# Patient Record
Sex: Female | Born: 1970 | Race: Black or African American | Hispanic: No | Marital: Single
Health system: Southern US, Community
[De-identification: ages and names within clinical notes are randomized; demographics above are authoritative.]

## PROBLEM LIST (undated history)

## (undated) DIAGNOSIS — F3189 Other bipolar disorder: Secondary | ICD-10-CM

## (undated) HISTORY — DX: Other bipolar disorder: F31.89

---

## 2007-09-27 ENCOUNTER — Emergency Department (HOSPITAL_COMMUNITY): Admission: EM | Admit: 2007-09-27 | Discharge: 2007-09-27 | Payer: Self-pay | Admitting: Emergency Medicine

## 2008-03-21 ENCOUNTER — Emergency Department (HOSPITAL_COMMUNITY): Admission: EM | Admit: 2008-03-21 | Discharge: 2008-03-21 | Payer: Self-pay | Admitting: Emergency Medicine

## 2008-03-24 ENCOUNTER — Emergency Department (HOSPITAL_COMMUNITY): Admission: EM | Admit: 2008-03-24 | Discharge: 2008-03-24 | Payer: Self-pay | Admitting: Emergency Medicine

## 2011-04-24 ENCOUNTER — Ambulatory Visit (HOSPITAL_COMMUNITY): Payer: Self-pay | Admitting: Physician Assistant

## 2011-05-23 ENCOUNTER — Ambulatory Visit (HOSPITAL_COMMUNITY): Payer: Managed Care, Other (non HMO) | Admitting: Physician Assistant

## 2011-05-23 DIAGNOSIS — F319 Bipolar disorder, unspecified: Secondary | ICD-10-CM

## 2011-06-20 ENCOUNTER — Encounter (HOSPITAL_COMMUNITY): Payer: Managed Care, Other (non HMO) | Admitting: Physician Assistant

## 2011-06-20 DIAGNOSIS — F3189 Other bipolar disorder: Secondary | ICD-10-CM

## 2011-07-31 LAB — URINALYSIS, ROUTINE W REFLEX MICROSCOPIC
Protein, ur: NEGATIVE
pH: 5

## 2011-08-06 ENCOUNTER — Encounter (HOSPITAL_COMMUNITY): Payer: Managed Care, Other (non HMO) | Admitting: Physician Assistant

## 2011-09-23 ENCOUNTER — Other Ambulatory Visit (HOSPITAL_COMMUNITY): Payer: Self-pay

## 2011-09-24 ENCOUNTER — Ambulatory Visit (HOSPITAL_COMMUNITY): Payer: Managed Care, Other (non HMO) | Admitting: Physician Assistant

## 2011-09-24 ENCOUNTER — Encounter (HOSPITAL_COMMUNITY): Payer: Self-pay | Admitting: Physician Assistant

## 2011-09-24 DIAGNOSIS — F988 Other specified behavioral and emotional disorders with onset usually occurring in childhood and adolescence: Secondary | ICD-10-CM

## 2011-09-24 MED ORDER — LISDEXAMFETAMINE DIMESYLATE 40 MG PO CAPS: 40.0000 mg | ORAL_CAPSULE | ORAL | Status: DC

## 2011-09-24 NOTE — Progress Notes (Signed)
   [facility] Behavioral Health Follow-up Outpatient Visit  Lea Baine 02/10/1971  Date: [id number]   Subjective: Has been off meds except Ambien.  C/O depression.  Felt Wellbutrin increased irritability.  Felt Topamax was causing increased daytime somnolence.  Continues to have symptoms of ADHD - irritability, easily distracted, disorganization, difficulty in completing projects...  Sleep is good.  Appetite: eating probably too much.  There were no vitals filed for this visit.  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  Call if necessary  Cashton Hosley, PA

## 2011-10-17 ENCOUNTER — Other Ambulatory Visit (HOSPITAL_COMMUNITY): Payer: Self-pay | Admitting: Psychology

## 2011-10-17 DIAGNOSIS — F3189 Other bipolar disorder: Secondary | ICD-10-CM

## 2011-10-17 DIAGNOSIS — G4700 Insomnia, unspecified: Secondary | ICD-10-CM

## 2011-10-17 MED ORDER — ZOLPIDEM TARTRATE 10 MG PO TABS: ORAL_TABLET | ORAL | Status: DC

## 2011-10-22 ENCOUNTER — Other Ambulatory Visit (HOSPITAL_COMMUNITY): Payer: Self-pay | Admitting: Physician Assistant

## 2011-10-22 DIAGNOSIS — F988 Other specified behavioral and emotional disorders with onset usually occurring in childhood and adolescence: Secondary | ICD-10-CM

## 2011-10-22 MED ORDER — LISDEXAMFETAMINE DIMESYLATE 40 MG PO CAPS: 40.0000 mg | ORAL_CAPSULE | ORAL | Status: DC

## 2011-10-24 ENCOUNTER — Ambulatory Visit (HOSPITAL_COMMUNITY): Payer: Managed Care, Other (non HMO) | Admitting: Physician Assistant

## 2011-12-02 ENCOUNTER — Ambulatory Visit (INDEPENDENT_AMBULATORY_CARE_PROVIDER_SITE_OTHER): Payer: Managed Care, Other (non HMO) | Admitting: Physician Assistant

## 2011-12-02 DIAGNOSIS — F988 Other specified behavioral and emotional disorders with onset usually occurring in childhood and adolescence: Secondary | ICD-10-CM

## 2011-12-02 DIAGNOSIS — F331 Major depressive disorder, recurrent, moderate: Secondary | ICD-10-CM

## 2011-12-02 MED ORDER — LISDEXAMFETAMINE DIMESYLATE 40 MG PO CAPS: 40.0000 mg | ORAL_CAPSULE | ORAL | Status: DC

## 2011-12-02 MED ORDER — ESCITALOPRAM OXALATE 10 MG PO TABS: 10.0000 mg | ORAL_TABLET | Freq: Every day | ORAL | Status: DC

## 2011-12-03 NOTE — Progress Notes (Signed)
   [facility] Behavioral Health Follow-up Outpatient Visit  Christina Carpenter [birth year]  Date: 12/02/11  Subjective:  Christina Carpenter presents today to followup on her medication for ADHD. She likes the Vyvanse and feels that it is working well. She also reports that he she is sleeping better with the Ambien. She reports that she has been depressed and wonders what she can take for that. She has been very tearful on a daily basis recently. She did not do well on Wellbutrin previously , complaining that it caused her to feel more irritable. She denies any current suicidal or homicidal ideation. She denies any auditory or visual hallucinations.   There were no vitals filed for this visit.  Mental Status Examination  Appearance: Well groomed and casually dressed Alert: Yes Attention: good  Cooperative: Yes Eye Contact: Good Speech: Clear and even Psychomotor Activity: Normal Memory/Concentration: Intact Oriented: person, place, time/date and situation Mood: Depressed Affect: Congruent and Tearful Thought Processes and Associations: Coherent, Goal Directed and Linear Fund of Knowledge: Good Thought Content:  Insight: Good Judgement: Good  Diagnosis: ADHD, inattentive type; major depressive disorder, recurrent, moderate  Treatment Plan: We will continue the Vyvanse and a Ambien as currently prescribed and try her on a low-dose (10 mg) of Lexapro. I discussed with her the action of SSRIs, including the possibility of experiencing a manic episode. She agrees to be watchful for that if I prescribe an SSRI. Christina Carpenter will return for followup in one month.   Mayo Faulk, PA

## 2011-12-30 ENCOUNTER — Ambulatory Visit (HOSPITAL_COMMUNITY): Payer: Managed Care, Other (non HMO) | Admitting: Physician Assistant

## 2012-01-06 ENCOUNTER — Other Ambulatory Visit (HOSPITAL_COMMUNITY): Payer: Self-pay | Admitting: Physician Assistant

## 2012-01-06 DIAGNOSIS — F331 Major depressive disorder, recurrent, moderate: Secondary | ICD-10-CM

## 2012-01-06 MED ORDER — ESCITALOPRAM OXALATE 10 MG PO TABS: 10.0000 mg | ORAL_TABLET | Freq: Every day | ORAL | Status: DC

## 2012-01-27 ENCOUNTER — Ambulatory Visit (INDEPENDENT_AMBULATORY_CARE_PROVIDER_SITE_OTHER): Payer: Managed Care, Other (non HMO) | Admitting: Physician Assistant

## 2012-01-27 DIAGNOSIS — F988 Other specified behavioral and emotional disorders with onset usually occurring in childhood and adolescence: Secondary | ICD-10-CM

## 2012-01-27 DIAGNOSIS — F331 Major depressive disorder, recurrent, moderate: Secondary | ICD-10-CM

## 2012-01-27 MED ORDER — ESCITALOPRAM OXALATE 10 MG PO TABS: 10.0000 mg | ORAL_TABLET | Freq: Every day | ORAL | Status: AC

## 2012-01-27 MED ORDER — LISDEXAMFETAMINE DIMESYLATE 40 MG PO CAPS: 40.0000 mg | ORAL_CAPSULE | ORAL | Status: DC

## 2012-01-27 NOTE — Progress Notes (Signed)
   [facility] Behavioral Health Follow-up Outpatient Visit  Miyoko Hashimi [birth year]  Date: 01/27/12   Subjective: Christina Carpenter presents today to followup on her medications prescribed for ADHD, depression, and insomnia. She reports that she has been doing extremely well. She has been taking the Lexapro 10 mg daily, and feels that that helps her to sleep better. She also does reports that she only takes her Ambien approximately once every 2 weeks. She feels that the dose of Vyvanse is appropriate, and denies any side effects. She denies any suicidal or homicidal ideation. She denies any auditory or visual hallucinations.  There were no vitals filed for this visit.  Mental Status Examination  Appearance: Well groomed and casually dressed Alert: Yes Attention: good  Cooperative: Yes Eye Contact: Good Speech: Clear and even Psychomotor Activity: Normal Memory/Concentration: Intact Oriented: person, place, time/date and situation Mood: Euthymic Affect: Appropriate Thought Processes and Associations: Linear Fund of Knowledge: Good Thought Content: Normal Insight: Good Judgement: Good  Diagnosis: ADHD, inattentive type; major depressive disorder recurrent moderate  Treatment Plan: We will continue her Vyvanse at 40 mg daily, Lexapro 10 mg daily, and Ambien 10 mg daily as needed.  Christina Petrucci, PA-C

## 2012-04-27 ENCOUNTER — Other Ambulatory Visit (HOSPITAL_COMMUNITY): Payer: Self-pay | Admitting: *Deleted

## 2012-04-27 DIAGNOSIS — F988 Other specified behavioral and emotional disorders with onset usually occurring in childhood and adolescence: Secondary | ICD-10-CM

## 2012-04-27 MED ORDER — LISDEXAMFETAMINE DIMESYLATE 40 MG PO CAPS: 40.0000 mg | ORAL_CAPSULE | ORAL | Status: DC

## 2012-04-28 ENCOUNTER — Ambulatory Visit (HOSPITAL_COMMUNITY): Payer: Self-pay | Admitting: Physician Assistant

## 2012-06-10 ENCOUNTER — Ambulatory Visit (INDEPENDENT_AMBULATORY_CARE_PROVIDER_SITE_OTHER): Payer: Managed Care, Other (non HMO) | Admitting: Physician Assistant

## 2012-06-10 DIAGNOSIS — F988 Other specified behavioral and emotional disorders with onset usually occurring in childhood and adolescence: Secondary | ICD-10-CM

## 2012-06-10 DIAGNOSIS — F331 Major depressive disorder, recurrent, moderate: Secondary | ICD-10-CM

## 2012-06-10 MED ORDER — LISDEXAMFETAMINE DIMESYLATE 60 MG PO CAPS: 60.0000 mg | ORAL_CAPSULE | ORAL | Status: DC

## 2012-06-10 MED ORDER — SERTRALINE HCL 50 MG PO TABS: 50.0000 mg | ORAL_TABLET | Freq: Every day | ORAL | Status: DC

## 2012-06-10 NOTE — Progress Notes (Signed)
   [facility] Behavioral Health Follow-up Outpatient Visit  Christina Carpenter May 08, 1971  Date: 06/10/2012   Subjective: [name] presents today to followup on her treatment for ADHD and depression. She feels that her Vyvanse dose may be a slight increase. She also asked if the Lexapro can cause her to have down days. She states that she has these days of sadness where she is very sensitive and cries easily last about one week. She admits that she stopped taking her Lexapro at least one week ago. She reports that her energy is fair. She endorses that she sleeps well, and only has to use the Ambien as needed once every 2-3 weeks. She denies any suicidal or homicidal ideation. She denies any auditory or visual hallucinations.  There were no vitals filed for this visit.  Mental Status Examination  Appearance: Well groomed and casually dressed in her work uniform Alert: Yes Attention: good  Cooperative: Yes Eye Contact: Good Speech: Clear and coherent Psychomotor Activity: Normal Memory/Concentration: Intact Oriented: person, place, time/date and situation Mood: Euthymic Affect: Appropriate Thought Processes and Associations: Linear Fund of Knowledge: Good Thought Content:  Insight: Good Judgement: Good  Diagnosis: ADHD, inattentive type; major depressive disorder, recurrent, moderate  Treatment Plan: We will start her on Zoloft 50 mg daily, increase her Vyvanse to 60 mg daily, and continue her Ambien to be used as needed. She will return in 3 months for followup.  Katie Faraone, PA-C

## 2012-08-31 ENCOUNTER — Other Ambulatory Visit (HOSPITAL_COMMUNITY): Payer: Self-pay | Admitting: *Deleted

## 2012-08-31 DIAGNOSIS — F331 Major depressive disorder, recurrent, moderate: Secondary | ICD-10-CM

## 2012-08-31 MED ORDER — SERTRALINE HCL 50 MG PO TABS: 50.0000 mg | ORAL_TABLET | Freq: Every day | ORAL | Status: DC

## 2012-09-08 ENCOUNTER — Ambulatory Visit (INDEPENDENT_AMBULATORY_CARE_PROVIDER_SITE_OTHER): Payer: Managed Care, Other (non HMO) | Admitting: Physician Assistant

## 2012-09-08 DIAGNOSIS — F330 Major depressive disorder, recurrent, mild: Secondary | ICD-10-CM

## 2012-09-08 DIAGNOSIS — F331 Major depressive disorder, recurrent, moderate: Secondary | ICD-10-CM

## 2012-09-08 DIAGNOSIS — F988 Other specified behavioral and emotional disorders with onset usually occurring in childhood and adolescence: Secondary | ICD-10-CM

## 2012-09-08 MED ORDER — LISDEXAMFETAMINE DIMESYLATE 60 MG PO CAPS: 60.0000 mg | ORAL_CAPSULE | ORAL | Status: DC

## 2012-09-08 MED ORDER — ZOLPIDEM TARTRATE 10 MG PO TABS: 10.0000 mg | ORAL_TABLET | Freq: Every evening | ORAL | Status: AC | PRN

## 2012-09-08 MED ORDER — SERTRALINE HCL 100 MG PO TABS: 100.0000 mg | ORAL_TABLET | Freq: Every day | ORAL | Status: DC

## 2012-09-08 NOTE — Progress Notes (Signed)
   [facility] Behavioral Health Follow-up Outpatient Visit  Marae Cottrell 1971-01-27  Date: 09/08/2012   Subjective: Kileen presents today to follow up on her treatment for ADHD and depression. She reports that the change from Lexapro to Zoloft went well, and she has not had as many down days. She does feel that the dose could be increased. She went about one month without having to take any Ambien, but recently restarted taking that. She feels that the Vyvanse dose is appropriate and feels no change for that. She endorses a stable mood. She denies any suicidal or homicidal ideation. She denies any auditory or visual hallucinations.  There were no vitals filed for this visit.  Mental Status Examination  Appearance: Well groomed and dressed in her work clothes Alert: Yes Attention: good  Cooperative: Yes Eye Contact: Good Speech: Clear and coherent Psychomotor Activity: Normal Memory/Concentration: Intact Oriented: person, place, time/date and situation Mood: Euthymic Affect: Appropriate Thought Processes and Associations: Linear Fund of Knowledge: Good Thought Content: Normal Insight: Good Judgement: Good  Diagnosis: ADHD, inattentive type; major depressive disorder, recurrent, mild  Treatment Plan: We will increase her Zoloft to 100 mg daily, continue her Vyvanse 60 mg daily, and continue Ambien 10 mg at bedtime as needed. She will return for followup in 3 months.  Braxson Hollingsworth, PA-C

## 2012-12-09 ENCOUNTER — Ambulatory Visit (INDEPENDENT_AMBULATORY_CARE_PROVIDER_SITE_OTHER): Payer: Managed Care, Other (non HMO) | Admitting: Physician Assistant

## 2012-12-09 DIAGNOSIS — F331 Major depressive disorder, recurrent, moderate: Secondary | ICD-10-CM

## 2012-12-09 DIAGNOSIS — F988 Other specified behavioral and emotional disorders with onset usually occurring in childhood and adolescence: Secondary | ICD-10-CM

## 2012-12-09 MED ORDER — ZOLPIDEM TARTRATE 10 MG PO TABS: 10.0000 mg | ORAL_TABLET | Freq: Every evening | ORAL | Status: AC | PRN

## 2012-12-09 MED ORDER — LISDEXAMFETAMINE DIMESYLATE 60 MG PO CAPS: 60.0000 mg | ORAL_CAPSULE | ORAL | Status: DC

## 2012-12-09 MED ORDER — SERTRALINE HCL 100 MG PO TABS: 150.0000 mg | ORAL_TABLET | Freq: Every day | ORAL | Status: DC

## 2013-01-01 NOTE — Progress Notes (Signed)
   [facility] Behavioral Health Follow-up Outpatient Visit  Christina Carpenter 09-Jul-1971  Date:  12/09/2012   Subjective: Christina Carpenter presents today to followup on her treatment for ADHD and depression. She reports that she has been having approximately one day a month where she is crying. She feels that her ADHD is well managed. Her sleep and appetite are good.  She denies any suicidal or homicidal ideation. She denies any auditory or visual hallucinations.  There were no vitals filed for this visit.  Mental Status Examination  Appearance: Casual Alert: Yes Attention: good  Cooperative: Yes Eye Contact: Good Speech: Clear and coherent Psychomotor Activity: Normal Memory/Concentration: Intact Oriented: person, place, time/date and situation Mood: Dysphoric Affect: Congruent Thought Processes and Associations: Linear Fund of Knowledge: Good Thought Content: Normal Insight: Good Judgement: Good  Diagnosis: ADHD, inattentive type; major depressive disorder, recurrent, moderate  Treatment Plan: We will increase her Zoloft from 100 mg to 150 mg daily for one month. If she has not experienced significant improvement in her depression, she is instructed to increase it to 200 mg daily. We will continue her Vyvanse 60 mg daily, and Ambien 10 mg at bedtime as needed. She will return for followup in 3 months.  Harish Bram, PA-C

## 2013-02-18 ENCOUNTER — Other Ambulatory Visit: Payer: Self-pay | Admitting: Family Medicine

## 2013-02-18 DIAGNOSIS — Z1231 Encounter for screening mammogram for malignant neoplasm of breast: Secondary | ICD-10-CM

## 2013-03-08 ENCOUNTER — Ambulatory Visit (HOSPITAL_COMMUNITY): Payer: Self-pay | Admitting: Physician Assistant

## 2013-03-15 ENCOUNTER — Ambulatory Visit
Admission: RE | Admit: 2013-03-15 | Discharge: 2013-03-15 | Disposition: A | Discharge status: Home / Self Care | Payer: Managed Care, Other (non HMO) | Source: Ambulatory Visit | Attending: Family Medicine | Admitting: Family Medicine

## 2013-03-15 DIAGNOSIS — Z1231 Encounter for screening mammogram for malignant neoplasm of breast: Secondary | ICD-10-CM

## 2013-03-25 ENCOUNTER — Other Ambulatory Visit (HOSPITAL_COMMUNITY): Payer: Self-pay | Admitting: *Deleted

## 2013-03-25 DIAGNOSIS — F331 Major depressive disorder, recurrent, moderate: Secondary | ICD-10-CM

## 2013-03-29 MED ORDER — SERTRALINE HCL 100 MG PO TABS: 150.0000 mg | ORAL_TABLET | Freq: Every day | ORAL | Status: DC

## 2013-05-04 ENCOUNTER — Ambulatory Visit (INDEPENDENT_AMBULATORY_CARE_PROVIDER_SITE_OTHER): Payer: Managed Care, Other (non HMO) | Admitting: Physician Assistant

## 2013-05-04 VITALS — BP 138/88 | HR 73 | Ht 63.0 in | Wt 185.0 lb

## 2013-05-04 DIAGNOSIS — F988 Other specified behavioral and emotional disorders with onset usually occurring in childhood and adolescence: Secondary | ICD-10-CM

## 2013-05-04 DIAGNOSIS — F331 Major depressive disorder, recurrent, moderate: Secondary | ICD-10-CM

## 2013-05-04 MED ORDER — LISDEXAMFETAMINE DIMESYLATE 60 MG PO CAPS: 60.0000 mg | ORAL_CAPSULE | ORAL | Status: AC

## 2013-05-04 MED ORDER — ARIPIPRAZOLE 5 MG PO TABS: 5.0000 mg | ORAL_TABLET | Freq: Every day | ORAL | Status: AC

## 2013-05-05 NOTE — Progress Notes (Signed)
[facility] Behavioral [facility] [id number] Progress Note  Arshi Duarte [id number] 42 y.o.  05/04/2013 1:15 PM  Chief Complaint: Followup visit for depression and ADHD  History of Present Illness: Christina Carpenter presents today to followup on her treatment for depression and ADHD. At her last visit we increased her Zoloft, and she says that she does not see much change since that increase. She reports that she is no longer crying. She states that she is "down a lot," and that her periods of depression last one to 2 days. She reports that she is tired all the time, although she is sleeping well with Ambien. She states that she goes to bed typically around 12 midnight or 1 AM, and then must be up at 6 in order to go to work. She is working 2 jobs, and does not get off of work until 9:30 PM. She endorses that she has a significant amount of irritability. She denies any suicidal or homicidal ideation. She does not have any auditory or visual hallucinations.  Suicidal Ideation: No Plan Formed: No Patient has means to carry out plan: No  Homicidal Ideation: No Plan Formed: No Patient has means to carry out plan: No  Review of Systems: Psychiatric: Agitation: No Hallucination: No Depressed Mood: Yes Insomnia: No Hypersomnia: No Altered Concentration: No Feels Worthless: No Grandiose Ideas: No Belief In Special Powers: No New/Increased Substance Abuse: No Compulsions: No  Neurologic: Headache: No Seizure: No Paresthesias: No   Outpatient Encounter Prescriptions as of 05/04/2013  Medication Sig Dispense Refill  . ARIPiprazole (ABILIFY) 5 MG tablet Take 1 tablet (5 mg total) by mouth daily.  30 tablet  2  . escitalopram (LEXAPRO) 10 MG tablet Take 1 tablet (10 mg total) by mouth at bedtime.  30 tablet  2  . lisdexamfetamine (VYVANSE) 60 MG capsule Take 1 capsule (60 mg total) by mouth every morning.  30 capsule  0  . lisdexamfetamine (VYVANSE) 60 MG capsule Take 1 capsule (60 mg total) by mouth every morning.   30 capsule  0  . lisdexamfetamine (VYVANSE) 60 MG capsule Take 1 capsule (60 mg total) by mouth every morning.  30 capsule  0  . sertraline (ZOLOFT) 100 MG tablet Take 1.5 tablets (150 mg total) by mouth daily.  45 tablet  1  . [DISCONTINUED] lisdexamfetamine (VYVANSE) 60 MG capsule Take 1 capsule (60 mg total) by mouth every morning.  30 capsule  0  . [DISCONTINUED] lisdexamfetamine (VYVANSE) 60 MG capsule Take 1 capsule (60 mg total) by mouth every morning.  30 capsule  0  . [DISCONTINUED] lisdexamfetamine (VYVANSE) 60 MG capsule Take 1 capsule (60 mg total) by mouth every morning.  30 capsule  0   No facility-administered encounter medications on file as of 05/04/2013.    Past Psychiatric History/Hospitalization(s): Anxiety: No Bipolar Disorder: No Depression: Yes Mania: No Psychosis: No Schizophrenia: No Personality Disorder: No Hospitalization for psychiatric illness: No History of Electroconvulsive Shock Therapy: No Prior Suicide Attempts: No  Physical Exam: Constitutional:  BP 138/88  Pulse 73  Ht 5\' 3"  (1.6 m)  Wt 185 lb (83.915 kg)  BMI 32.78 kg/m2  General Appearance: alert, oriented, no acute distress, well nourished and casual  Musculoskeletal: Strength & Muscle Tone: within normal limits Gait & Station: normal Patient leans: N/A  Psychiatric: Speech (describe rate, volume, coherence, spontaneity, and abnormalities if any): Clear and coherent had a regular rate and rhythm and normal volume  Thought Process (describe rate, content, abstract reasoning, and computation): Within normal limits  Associations: Intact  Thoughts: normal  Mental Status: Orientation: oriented to person, place, time/date and situation Mood & Affect: Dysphoric mood and congruent affect Attention Span & Concentration: Intact  Medical Decision Making (Choose Three): Established Problem, Stable/Improving (1), Review of Psycho-Social Stressors (1), Review of Medication Regimen & Side  Effects (2) and Review of New Medication or Change in Dosage (2)  Assessment: Axis I: Maj. depressive disorder, recurrent, moderate; ADHD, inattentive type  Axis II: Deferred  Axis III: Noncontributory  Axis IV: Moderate  Axis V: 60   Plan: We will continue her Zoloft 150 mg daily, and Vyvanse 60 mg daily, and Ambien 10 mg at bedtime as needed for sleep. We will begin Abilify 5 mg daily to address her irritability and depressed mood. She is encouraged to try to get to bed earlier and get another hour or so of sleep. She will return for followup in 3 months. She is encouraged to call between appointments if need arises  Eriel Dunckel, PA-C 05/05/2013

## 2013-05-20 ENCOUNTER — Other Ambulatory Visit (HOSPITAL_COMMUNITY): Payer: Self-pay | Admitting: Physician Assistant

## 2013-07-19 ENCOUNTER — Other Ambulatory Visit (HOSPITAL_COMMUNITY): Payer: Self-pay | Admitting: *Deleted

## 2013-07-19 DIAGNOSIS — F331 Major depressive disorder, recurrent, moderate: Secondary | ICD-10-CM

## 2013-07-19 MED ORDER — SERTRALINE HCL 100 MG PO TABS: ORAL_TABLET | ORAL | Status: AC

## 2013-08-04 ENCOUNTER — Ambulatory Visit (HOSPITAL_COMMUNITY): Payer: Self-pay | Admitting: Physician Assistant

## 2013-09-20 ENCOUNTER — Other Ambulatory Visit (HOSPITAL_COMMUNITY): Payer: Self-pay | Admitting: Physician Assistant

## 2015-09-18 ENCOUNTER — Other Ambulatory Visit: Payer: Self-pay

## 2015-09-18 DIAGNOSIS — Z1231 Encounter for screening mammogram for malignant neoplasm of breast: Secondary | ICD-10-CM

## 2015-10-10 ENCOUNTER — Ambulatory Visit
Admission: RE | Admit: 2015-10-10 | Discharge: 2015-10-10 | Disposition: A | Discharge status: Home / Self Care | Payer: BLUE CROSS/BLUE SHIELD | Source: Ambulatory Visit

## 2015-10-10 DIAGNOSIS — Z1231 Encounter for screening mammogram for malignant neoplasm of breast: Secondary | ICD-10-CM

## 2016-09-03 ENCOUNTER — Other Ambulatory Visit: Payer: Self-pay | Admitting: Physician Assistant

## 2016-09-03 DIAGNOSIS — Z1231 Encounter for screening mammogram for malignant neoplasm of breast: Secondary | ICD-10-CM

## 2016-10-16 ENCOUNTER — Other Ambulatory Visit: Payer: Self-pay | Admitting: *Deleted

## 2016-10-16 ENCOUNTER — Encounter: Payer: Self-pay | Admitting: Radiology

## 2016-10-16 ENCOUNTER — Ambulatory Visit
Admission: RE | Admit: 2016-10-16 | Discharge: 2016-10-16 | Disposition: A | Discharge status: Home / Self Care | Payer: BLUE CROSS/BLUE SHIELD | Source: Ambulatory Visit | Attending: Physician Assistant | Admitting: Physician Assistant

## 2016-10-16 DIAGNOSIS — Z1231 Encounter for screening mammogram for malignant neoplasm of breast: Secondary | ICD-10-CM

## 2017-09-18 ENCOUNTER — Other Ambulatory Visit: Payer: Self-pay | Admitting: Physician Assistant

## 2017-09-18 DIAGNOSIS — Z1231 Encounter for screening mammogram for malignant neoplasm of breast: Secondary | ICD-10-CM

## 2017-10-16 ENCOUNTER — Ambulatory Visit: Payer: Self-pay

## 2017-10-17 ENCOUNTER — Encounter (INDEPENDENT_AMBULATORY_CARE_PROVIDER_SITE_OTHER): Payer: Self-pay

## 2017-10-17 ENCOUNTER — Ambulatory Visit
Admission: RE | Admit: 2017-10-17 | Discharge: 2017-10-17 | Disposition: A | Discharge status: Home / Self Care | Payer: BLUE CROSS/BLUE SHIELD | Source: Ambulatory Visit | Attending: Physician Assistant | Admitting: Physician Assistant

## 2017-10-17 DIAGNOSIS — Z1231 Encounter for screening mammogram for malignant neoplasm of breast: Secondary | ICD-10-CM

## 2018-11-05 ENCOUNTER — Other Ambulatory Visit: Payer: Self-pay | Admitting: Nurse Practitioner

## 2018-11-05 DIAGNOSIS — Z1231 Encounter for screening mammogram for malignant neoplasm of breast: Secondary | ICD-10-CM

## 2018-12-02 ENCOUNTER — Ambulatory Visit
Admission: RE | Admit: 2018-12-02 | Discharge: 2018-12-02 | Disposition: A | Discharge status: Home / Self Care | Payer: BLUE CROSS/BLUE SHIELD | Source: Ambulatory Visit | Attending: Nurse Practitioner | Admitting: Nurse Practitioner

## 2018-12-02 DIAGNOSIS — Z1231 Encounter for screening mammogram for malignant neoplasm of breast: Secondary | ICD-10-CM

## 2019-07-07 ENCOUNTER — Other Ambulatory Visit: Payer: Self-pay

## 2019-07-07 DIAGNOSIS — Z20822 Contact with and (suspected) exposure to covid-19: Secondary | ICD-10-CM

## 2019-07-08 LAB — NOVEL CORONAVIRUS, NAA: SARS-CoV-2, NAA: NOT DETECTED

## 2019-07-08 LAB — SPECIMEN STATUS REPORT

## 2019-08-10 ENCOUNTER — Other Ambulatory Visit: Payer: Self-pay

## 2019-08-10 DIAGNOSIS — Z20822 Contact with and (suspected) exposure to covid-19: Secondary | ICD-10-CM

## 2019-08-12 LAB — SPECIMEN STATUS REPORT

## 2019-08-12 LAB — NOVEL CORONAVIRUS, NAA: SARS-CoV-2, NAA: NOT DETECTED

## 2019-09-07 ENCOUNTER — Other Ambulatory Visit: Payer: Self-pay

## 2019-09-07 DIAGNOSIS — Z20822 Contact with and (suspected) exposure to covid-19: Secondary | ICD-10-CM

## 2019-09-08 LAB — NOVEL CORONAVIRUS, NAA: SARS-CoV-2, NAA: NOT DETECTED

## 2019-10-11 ENCOUNTER — Other Ambulatory Visit: Payer: Self-pay

## 2019-10-11 DIAGNOSIS — Z20822 Contact with and (suspected) exposure to covid-19: Secondary | ICD-10-CM

## 2019-10-12 LAB — NOVEL CORONAVIRUS, NAA: SARS-CoV-2, NAA: NOT DETECTED

## 2020-01-10 ENCOUNTER — Other Ambulatory Visit: Payer: Self-pay | Admitting: Nurse Practitioner

## 2020-01-10 DIAGNOSIS — Z1231 Encounter for screening mammogram for malignant neoplasm of breast: Secondary | ICD-10-CM

## 2020-01-11 ENCOUNTER — Other Ambulatory Visit: Payer: Self-pay

## 2020-01-11 ENCOUNTER — Ambulatory Visit
Admission: RE | Admit: 2020-01-11 | Discharge: 2020-01-11 | Disposition: A | Discharge status: Home / Self Care | Payer: BC Managed Care – PPO | Source: Ambulatory Visit

## 2020-01-11 DIAGNOSIS — Z1231 Encounter for screening mammogram for malignant neoplasm of breast: Secondary | ICD-10-CM

## 2020-06-06 ENCOUNTER — Other Ambulatory Visit: Payer: Self-pay

## 2020-06-06 ENCOUNTER — Other Ambulatory Visit: Payer: BC Managed Care – PPO

## 2020-06-06 DIAGNOSIS — Z20822 Contact with and (suspected) exposure to covid-19: Secondary | ICD-10-CM

## 2020-06-07 LAB — SARS-COV-2, NAA 2 DAY TAT

## 2020-06-07 LAB — NOVEL CORONAVIRUS, NAA: SARS-CoV-2, NAA: NOT DETECTED

## 2020-06-23 ENCOUNTER — Other Ambulatory Visit: Payer: Self-pay | Admitting: Sleep Medicine

## 2020-06-23 ENCOUNTER — Other Ambulatory Visit: Payer: BC Managed Care – PPO

## 2020-06-23 DIAGNOSIS — Z20822 Contact with and (suspected) exposure to covid-19: Secondary | ICD-10-CM

## 2020-06-24 LAB — SPECIMEN STATUS REPORT

## 2020-06-24 LAB — NOVEL CORONAVIRUS, NAA: SARS-CoV-2, NAA: NOT DETECTED

## 2020-06-24 LAB — SARS-COV-2, NAA 2 DAY TAT

## 2020-06-28 ENCOUNTER — Other Ambulatory Visit: Payer: Self-pay | Admitting: Critical Care Medicine

## 2020-06-28 ENCOUNTER — Other Ambulatory Visit: Payer: BC Managed Care – PPO

## 2020-06-28 DIAGNOSIS — Z20822 Contact with and (suspected) exposure to covid-19: Secondary | ICD-10-CM

## 2020-06-29 LAB — NOVEL CORONAVIRUS, NAA: SARS-CoV-2, NAA: NOT DETECTED

## 2020-06-29 LAB — SARS-COV-2, NAA 2 DAY TAT

## 2020-07-11 ENCOUNTER — Other Ambulatory Visit: Payer: Self-pay

## 2020-07-11 DIAGNOSIS — Z20822 Contact with and (suspected) exposure to covid-19: Secondary | ICD-10-CM

## 2020-07-12 LAB — SARS-COV-2, NAA 2 DAY TAT

## 2020-07-12 LAB — NOVEL CORONAVIRUS, NAA: SARS-CoV-2, NAA: NOT DETECTED

## 2020-07-21 ENCOUNTER — Other Ambulatory Visit: Payer: Self-pay

## 2020-07-21 DIAGNOSIS — Z20822 Contact with and (suspected) exposure to covid-19: Secondary | ICD-10-CM

## 2020-07-24 LAB — NOVEL CORONAVIRUS, NAA: SARS-CoV-2, NAA: NOT DETECTED

## 2020-09-02 ENCOUNTER — Other Ambulatory Visit: Payer: Self-pay

## 2020-09-05 ENCOUNTER — Other Ambulatory Visit: Payer: Self-pay

## 2020-09-05 ENCOUNTER — Ambulatory Visit: Payer: Self-pay

## 2020-09-05 ENCOUNTER — Ambulatory Visit (INDEPENDENT_AMBULATORY_CARE_PROVIDER_SITE_OTHER): Payer: 59 | Admitting: Podiatry

## 2020-09-05 ENCOUNTER — Ambulatory Visit (INDEPENDENT_AMBULATORY_CARE_PROVIDER_SITE_OTHER): Payer: 59

## 2020-09-05 DIAGNOSIS — M79671 Pain in right foot: Secondary | ICD-10-CM

## 2020-09-05 DIAGNOSIS — M2142 Flat foot [pes planus] (acquired), left foot: Secondary | ICD-10-CM | POA: Diagnosis not present

## 2020-09-05 DIAGNOSIS — M216X1 Other acquired deformities of right foot: Secondary | ICD-10-CM

## 2020-09-05 DIAGNOSIS — M216X2 Other acquired deformities of left foot: Secondary | ICD-10-CM | POA: Diagnosis not present

## 2020-09-05 DIAGNOSIS — M2141 Flat foot [pes planus] (acquired), right foot: Secondary | ICD-10-CM | POA: Diagnosis not present

## 2020-09-05 DIAGNOSIS — M79672 Pain in left foot: Secondary | ICD-10-CM

## 2020-09-05 NOTE — Progress Notes (Signed)
  Subjective:  Patient ID: Christina Carpenter, female    DOB: 01-Sep-1971,  MRN: [id number]  Chief Complaint  Patient presents with  . Foot Pain    Bilateral pain in arches     49 y.o. female presents with the above complaint. History confirmed with patient.  Worse when walking.  Along the lateral side of the foot and arch.  Objective:  Physical Exam: warm, good capillary refill, no trophic changes or ulcerative lesions, normal DP and PT pulses and normal sensory exam.  She has mild pes cavus on weightbearing with majority of the weight on the lateral column.  Mild pain on palpation along the plantar fascial lateral band.  No pain in the plantar heel.  No pain in the posterior heel.  No evidence of fracture or deformity.  Weightbearing radiographs of both feet dated 09/05/2020: Mild pes cavus, no evidence of fracture dislocation or other acute osseous abnormality Assessment:   1. Foot pain, bilateral   2. Acquired bilateral pes cavus      Plan:  Patient was evaluated and treated and all questions answered.   Discussed with her her foot type and types of shoes would be most beneficial for her.  Also recommended inserts.  Discussed with her that premade inserts (which she has tried so far) may not be as beneficial for review she has a slightly higher arch foot.  She will consider custom molded orthoses and return if she is interested in these.  Advised her to try to wear shoes and person at the [employer] of [place] or the [employer].  Return if symptoms worsen or fail to improve.

## 2020-09-06 ENCOUNTER — Encounter (INDEPENDENT_AMBULATORY_CARE_PROVIDER_SITE_OTHER): Payer: Self-pay

## 2020-09-19 ENCOUNTER — Other Ambulatory Visit: Payer: Self-pay | Admitting: Podiatry

## 2020-09-19 DIAGNOSIS — M2141 Flat foot [pes planus] (acquired), right foot: Secondary | ICD-10-CM

## 2020-09-23 ENCOUNTER — Other Ambulatory Visit: Payer: Self-pay

## 2020-09-23 DIAGNOSIS — Z20822 Contact with and (suspected) exposure to covid-19: Secondary | ICD-10-CM

## 2020-09-24 LAB — SPECIMEN STATUS REPORT

## 2020-09-24 LAB — SARS-COV-2, NAA 2 DAY TAT

## 2020-09-24 LAB — NOVEL CORONAVIRUS, NAA: SARS-CoV-2, NAA: NOT DETECTED

## 2020-10-07 ENCOUNTER — Other Ambulatory Visit: Payer: Self-pay

## 2020-10-07 DIAGNOSIS — Z20822 Contact with and (suspected) exposure to covid-19: Secondary | ICD-10-CM

## 2020-10-08 LAB — NOVEL CORONAVIRUS, NAA: SARS-CoV-2, NAA: NOT DETECTED

## 2020-10-08 LAB — SARS-COV-2, NAA 2 DAY TAT

## 2021-01-02 ENCOUNTER — Other Ambulatory Visit: Payer: Self-pay | Admitting: Nurse Practitioner

## 2021-01-02 DIAGNOSIS — Z1231 Encounter for screening mammogram for malignant neoplasm of breast: Secondary | ICD-10-CM

## 2021-01-30 ENCOUNTER — Ambulatory Visit: Payer: Self-pay | Admitting: Internal Medicine

## 2021-02-01 ENCOUNTER — Ambulatory Visit
Admission: RE | Admit: 2021-02-01 | Discharge: 2021-02-01 | Disposition: A | Discharge status: Home / Self Care | Payer: 59 | Source: Ambulatory Visit

## 2021-02-01 ENCOUNTER — Other Ambulatory Visit: Payer: Self-pay

## 2021-02-01 DIAGNOSIS — Z1231 Encounter for screening mammogram for malignant neoplasm of breast: Secondary | ICD-10-CM

## 2021-06-06 ENCOUNTER — Encounter (HOSPITAL_COMMUNITY): Payer: Self-pay

## 2021-06-06 ENCOUNTER — Emergency Department (HOSPITAL_COMMUNITY)
Admission: EM | Admit: 2021-06-06 | Discharge: 2021-06-07 | Disposition: A | Discharge status: Home / Self Care | Payer: 59 | Attending: Emergency Medicine | Admitting: Emergency Medicine

## 2021-06-06 ENCOUNTER — Emergency Department (HOSPITAL_COMMUNITY): Payer: 59

## 2021-06-06 DIAGNOSIS — M25511 Pain in right shoulder: Secondary | ICD-10-CM | POA: Diagnosis not present

## 2021-06-06 DIAGNOSIS — R519 Headache, unspecified: Secondary | ICD-10-CM | POA: Insufficient documentation

## 2021-06-06 DIAGNOSIS — Y92410 Unspecified street and highway as the place of occurrence of the external cause: Secondary | ICD-10-CM | POA: Diagnosis not present

## 2021-06-06 DIAGNOSIS — M542 Cervicalgia: Secondary | ICD-10-CM | POA: Insufficient documentation

## 2021-06-06 DIAGNOSIS — Z5321 Procedure and treatment not carried out due to patient leaving prior to being seen by health care provider: Secondary | ICD-10-CM | POA: Insufficient documentation

## 2021-06-06 MED ORDER — ONDANSETRON 4 MG PO TBDP
4.0000 mg | ORAL_TABLET | Freq: Once | ORAL | Status: AC
  Administered 2021-06-06: 4 mg via ORAL
  Filled 2021-06-06: qty 1
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
## 2021-06-07 ENCOUNTER — Emergency Department (HOSPITAL_COMMUNITY): Payer: 59

## 2021-06-07 NOTE — ED Notes (Signed)
Patient states she is leaving d/t wait time 

## 2023-04-09 DIAGNOSIS — Z823 Family history of stroke: Secondary | ICD-10-CM | POA: Diagnosis not present

## 2023-04-09 DIAGNOSIS — J45909 Unspecified asthma, uncomplicated: Secondary | ICD-10-CM | POA: Diagnosis not present

## 2023-04-09 DIAGNOSIS — Z885 Allergy status to narcotic agent status: Secondary | ICD-10-CM | POA: Diagnosis not present

## 2023-04-09 DIAGNOSIS — Z87892 Personal history of anaphylaxis: Secondary | ICD-10-CM | POA: Diagnosis not present
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
## 2023-06-16 DIAGNOSIS — J0100 Acute maxillary sinusitis, unspecified: Secondary | ICD-10-CM | POA: Diagnosis not present

## 2023-06-16 DIAGNOSIS — Z6832 Body mass index (BMI) 32.0-32.9, adult: Secondary | ICD-10-CM | POA: Diagnosis not present

## 2024-09-01 ENCOUNTER — Other Ambulatory Visit: Payer: Self-pay | Admitting: Physician Assistant

## 2024-09-01 DIAGNOSIS — Z1231 Encounter for screening mammogram for malignant neoplasm of breast: Secondary | ICD-10-CM

## 2024-09-03 ENCOUNTER — Ambulatory Visit
Admission: RE | Admit: 2024-09-03 | Discharge: 2024-09-03 | Disposition: A | Discharge status: Home / Self Care | Payer: Self-pay | Source: Ambulatory Visit | Attending: Physician Assistant | Admitting: Physician Assistant

## 2024-09-03 DIAGNOSIS — Z1231 Encounter for screening mammogram for malignant neoplasm of breast: Secondary | ICD-10-CM
